# Patient Record
Sex: Male | Born: 1950 | Race: Black or African American | Hispanic: No | Marital: Married
Health system: Southern US, Community
[De-identification: ages and names within clinical notes are randomized; demographics above are authoritative.]

## PROBLEM LIST (undated history)

## (undated) DIAGNOSIS — Z940 Kidney transplant status: Secondary | ICD-10-CM

## (undated) DIAGNOSIS — I739 Peripheral vascular disease, unspecified: Secondary | ICD-10-CM

## (undated) HISTORY — DX: Peripheral vascular disease, unspecified: I73.9

## (undated) HISTORY — DX: Kidney transplant status: Z94.0

---

## 1998-01-30 ENCOUNTER — Emergency Department (HOSPITAL_COMMUNITY): Admission: EM | Admit: 1998-01-30 | Discharge: 1998-01-30 | Payer: Self-pay | Admitting: Emergency Medicine

## 1998-10-23 ENCOUNTER — Encounter: Payer: Self-pay | Admitting: Vascular Surgery

## 1998-10-23 ENCOUNTER — Ambulatory Visit (HOSPITAL_COMMUNITY): Admission: RE | Admit: 1998-10-23 | Discharge: 1998-10-23 | Payer: Self-pay | Admitting: Vascular Surgery

## 1998-10-27 ENCOUNTER — Encounter (HOSPITAL_COMMUNITY): Admission: RE | Admit: 1998-10-27 | Discharge: 1999-01-25 | Payer: Self-pay | Admitting: Nephrology

## 1999-01-26 ENCOUNTER — Encounter (HOSPITAL_COMMUNITY): Admission: RE | Admit: 1999-01-26 | Discharge: 1999-04-26 | Payer: Self-pay | Admitting: Nephrology

## 1999-05-04 ENCOUNTER — Encounter (HOSPITAL_COMMUNITY): Admission: RE | Admit: 1999-05-04 | Discharge: 1999-08-02 | Payer: Self-pay | Admitting: Nephrology

## 1999-09-25 ENCOUNTER — Ambulatory Visit (HOSPITAL_COMMUNITY): Admission: RE | Admit: 1999-09-25 | Discharge: 1999-09-26 | Payer: Self-pay | Admitting: General Surgery

## 1999-09-25 ENCOUNTER — Encounter: Payer: Self-pay | Admitting: General Surgery

## 1999-09-26 ENCOUNTER — Emergency Department (HOSPITAL_COMMUNITY): Admission: EM | Admit: 1999-09-26 | Discharge: 1999-09-26 | Payer: Self-pay | Admitting: Emergency Medicine

## 2004-03-17 ENCOUNTER — Encounter: Admission: RE | Admit: 2004-03-17 | Discharge: 2004-06-10 | Payer: Self-pay | Admitting: Nephrology

## 2005-10-15 ENCOUNTER — Ambulatory Visit (HOSPITAL_COMMUNITY): Admission: RE | Admit: 2005-10-15 | Discharge: 2005-10-15 | Payer: Self-pay | Admitting: Nephrology

## 2006-04-20 ENCOUNTER — Ambulatory Visit: Payer: Self-pay | Admitting: Gastroenterology

## 2006-04-28 ENCOUNTER — Ambulatory Visit (HOSPITAL_COMMUNITY): Admission: RE | Admit: 2006-04-28 | Discharge: 2006-04-28 | Payer: Self-pay | Admitting: Gastroenterology

## 2006-05-03 ENCOUNTER — Ambulatory Visit: Payer: Self-pay | Admitting: Gastroenterology

## 2006-10-04 ENCOUNTER — Emergency Department (HOSPITAL_COMMUNITY): Admission: EM | Admit: 2006-10-04 | Discharge: 2006-10-04 | Payer: Self-pay | Admitting: Emergency Medicine

## 2007-05-15 ENCOUNTER — Emergency Department (HOSPITAL_COMMUNITY): Admission: EM | Admit: 2007-05-15 | Discharge: 2007-05-15 | Payer: Self-pay | Admitting: Emergency Medicine

## 2007-05-22 ENCOUNTER — Emergency Department (HOSPITAL_COMMUNITY): Admission: EM | Admit: 2007-05-22 | Discharge: 2007-05-22 | Payer: Self-pay | Admitting: Emergency Medicine

## 2007-10-14 ENCOUNTER — Inpatient Hospital Stay (HOSPITAL_COMMUNITY): Admission: EM | Admit: 2007-10-14 | Discharge: 2007-10-18 | Payer: Self-pay | Admitting: Emergency Medicine

## 2009-03-11 ENCOUNTER — Inpatient Hospital Stay (HOSPITAL_COMMUNITY): Admission: EM | Admit: 2009-03-11 | Discharge: 2009-03-14 | Payer: Self-pay | Admitting: Emergency Medicine

## 2009-03-19 IMAGING — CT CT HEAD W/O CM
1 series · 16 of 28 positions shown, 20 images · IV contrast (agent unspecified)
Comparison: No prior studies are available for comparison.

CLINICAL DATA: Headache for 5 days. Dialysis patient. History of CVA, hypertension, and renal failure.
HEAD CT WITHOUT CONTRAST:
TECHNIQUE: Contiguous axial images were obtained from the base of the skull through the vertex according to standard protocol without contrast.

[Series 2: brain · axial · 0.47mm/px · z∈[+171,+304]mm · 16 of 28 slices shown, 20 images]
[im 2/28  brain]
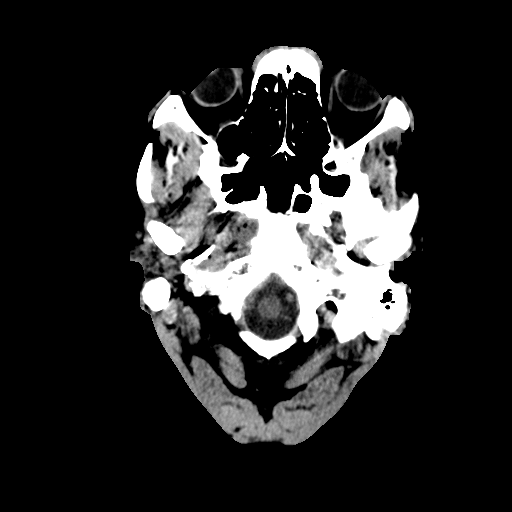
[im 2/28  bone]
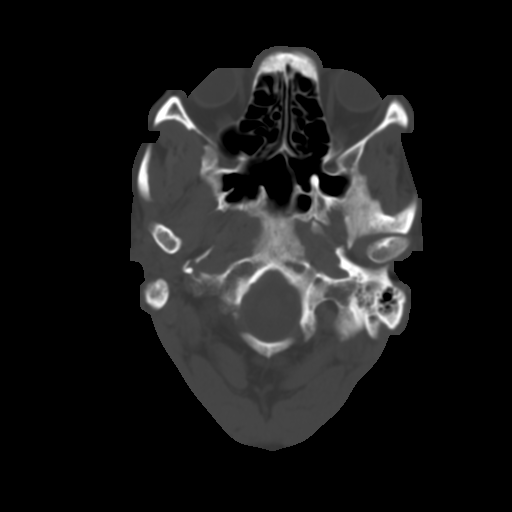
[im 4/28  brain]
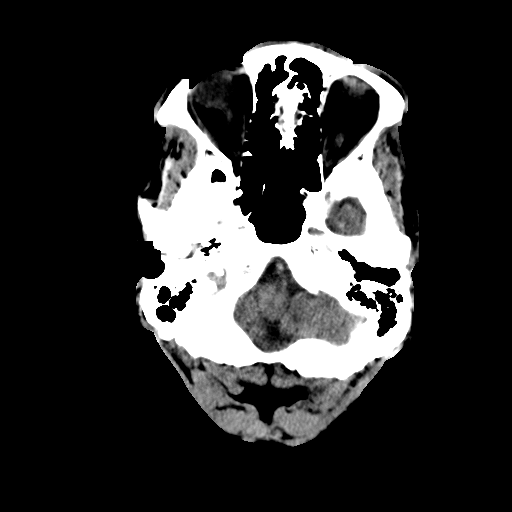
[im 6/28  brain]
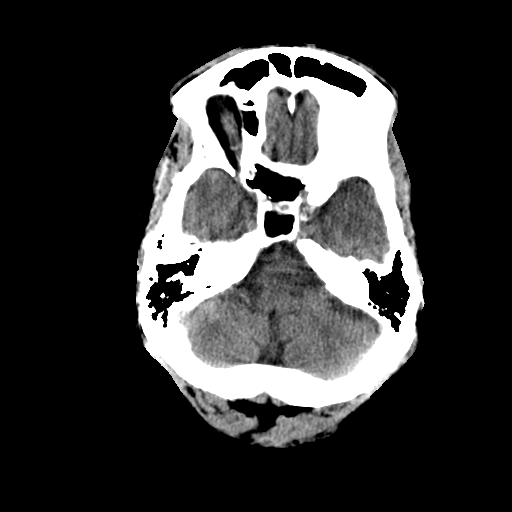
[im 7/28  brain]
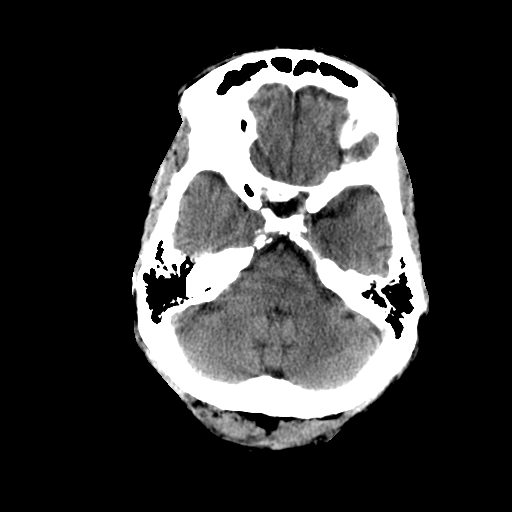
[im 9/28  brain]
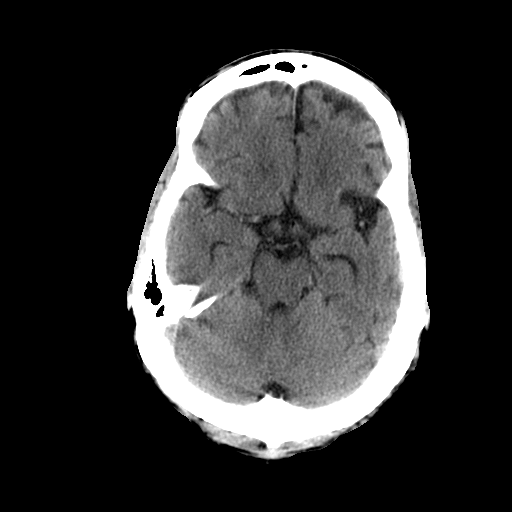
[im 9/28  bone]
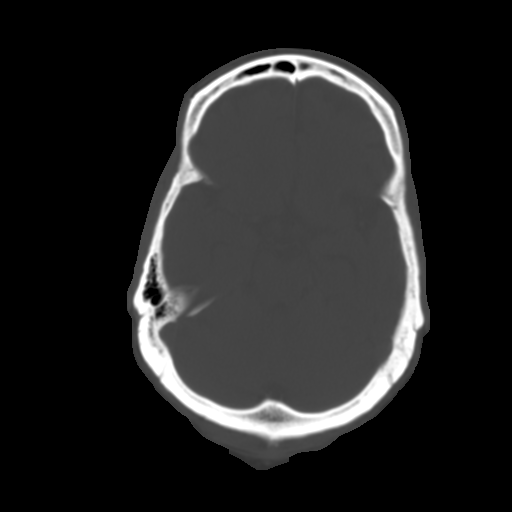
[im 10/28  brain]
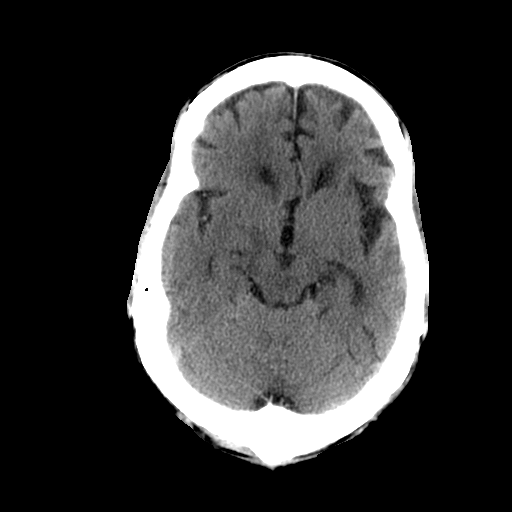
[im 12/28  brain]
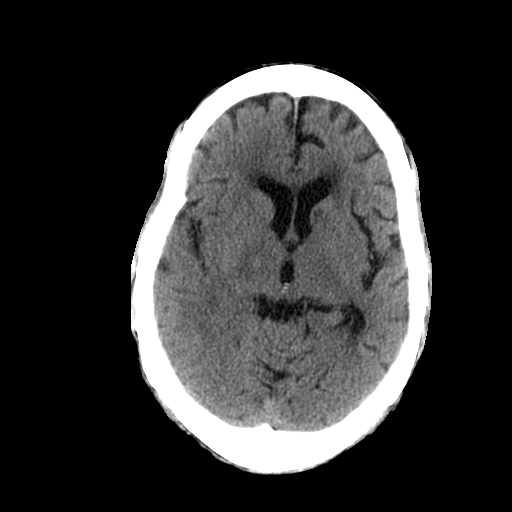
[im 14/28  brain]
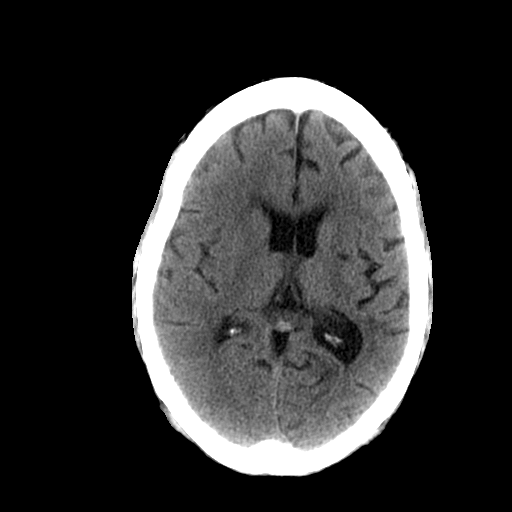
[im 15/28  brain]
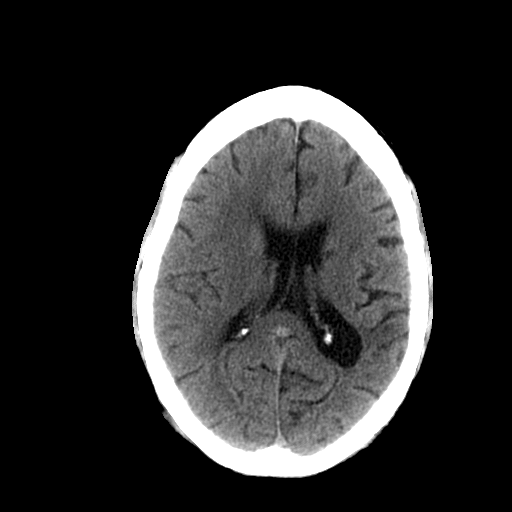
[im 15/28  bone]
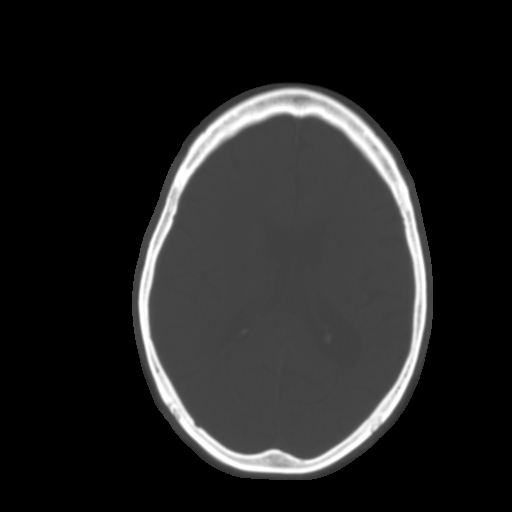
[im 17/28  brain]
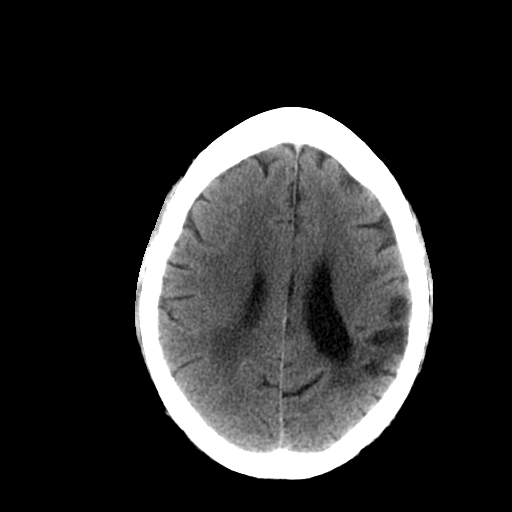
[im 19/28  brain]
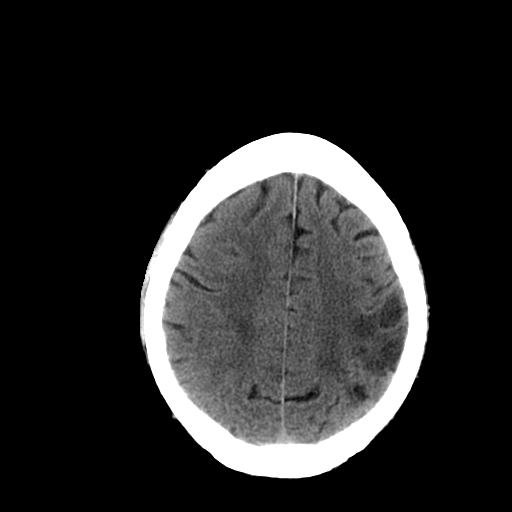
[im 20/28  brain]
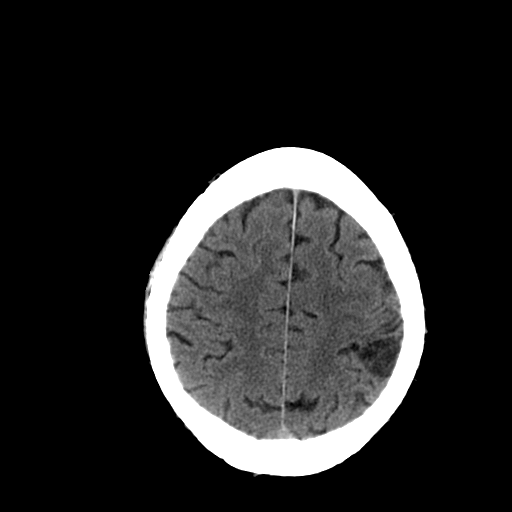
[im 22/28  brain]
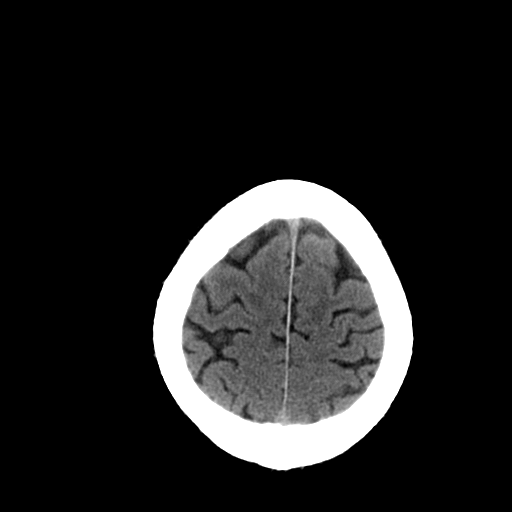
[im 22/28  bone]
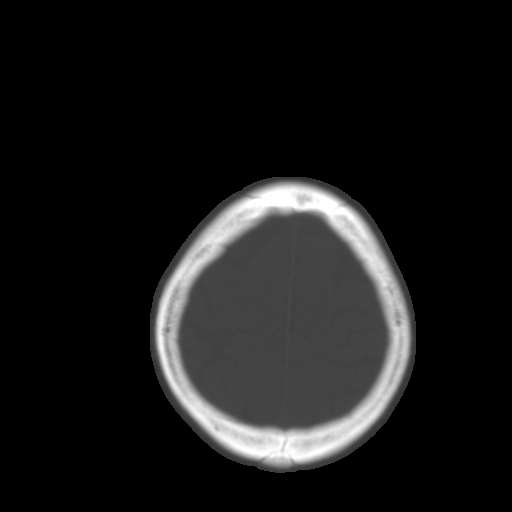
[im 23/28  brain]
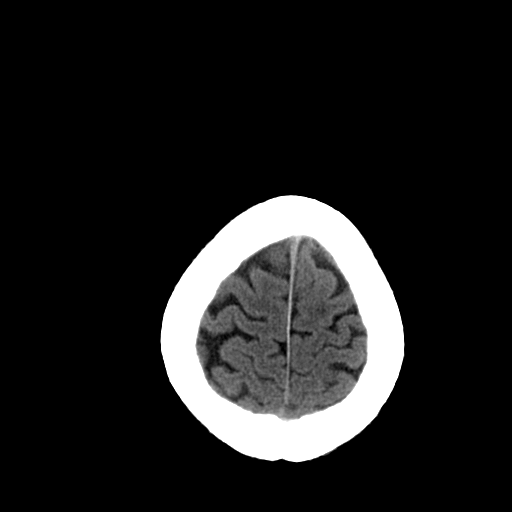
[im 25/28  brain]
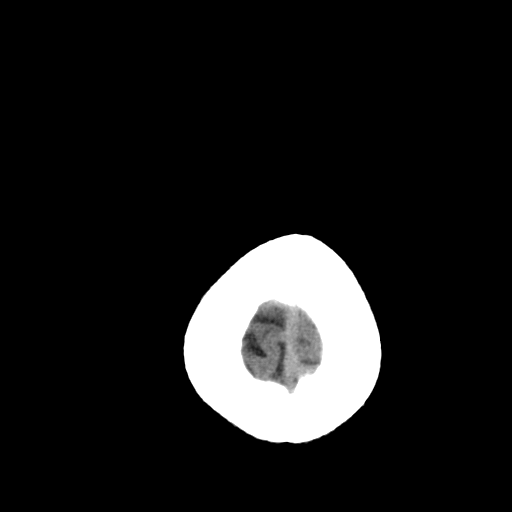
[im 27/28  brain]
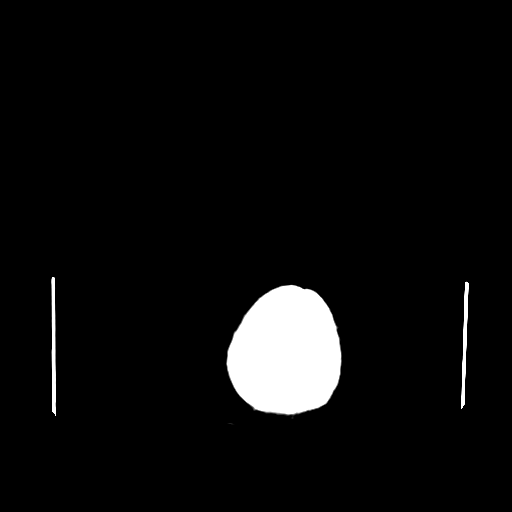

[16 of 28 positions shown; findings below may reference images not displayed]

FINDINGS: Cortical atrophic changes mainly involve the frontal lobes. Remote lacunar infarctions left caudate nuclear head and left putamen plus anterior aspect left caudate body.  Encephalomalacic changes left upper frontoparietal territory compatible with remote infarction involving a branch of the left MCA. No findings to strongly suggest an acute infarction. No hemorrhage or mass effect. No hydrocephalus.
IMPRESSION: No acute intracranial abnormality. Remote lacunar infarctions left basal ganglia and remote left upper frontoparietal infarction.

## 2009-08-18 IMAGING — CT CT PELVIS W/ CM
2 of 5 series · 16 of 46 positions shown, 18 images · IV contrast (omni 300/water & 100 ML OMNI 300)
Comparison: Abdominal radiograph 05/22/2007.

CT ABDOMEN

CLINICAL DATA: 56-year-old male with periumbilical pain times 7
hours.  End-stage renal disease on dialysis.  Left nephrectomy,
possibly for cancer.

CT ABDOMEN AND PELVIS WITH CONTRAST
TECHNIQUE: Multidetector CT imaging of the abdomen and pelvis was
performed using the standard protocol following bolus
administration of intravenous contrast.
Contrast: 100 ml Pmnipaque-WBB.

[Series 2: a&p w/ · axial · 0.66mm/px · z∈[-378,-34]mm · 13 of 79 slices shown, 15 images]
[im 5/79  soft-tissue]
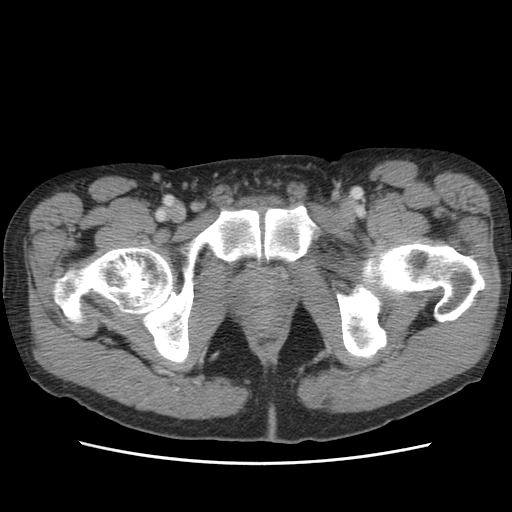
[im 5/79  bone]
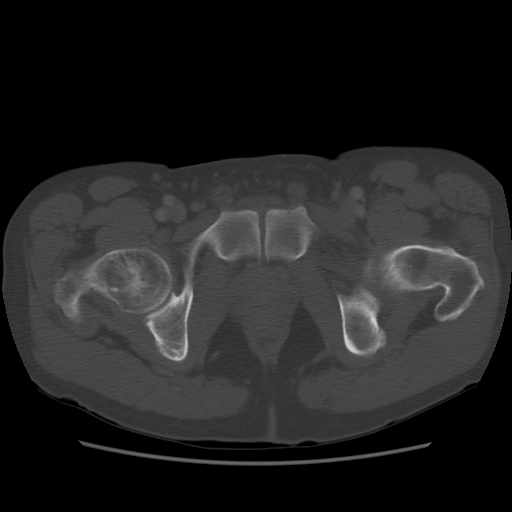
[im 13/79  soft-tissue]
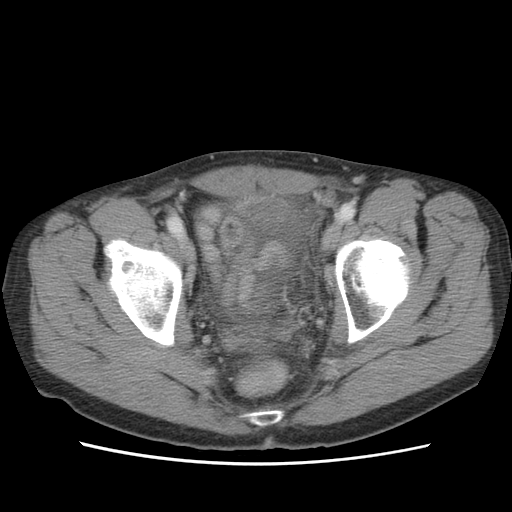
[im 17/79  soft-tissue]
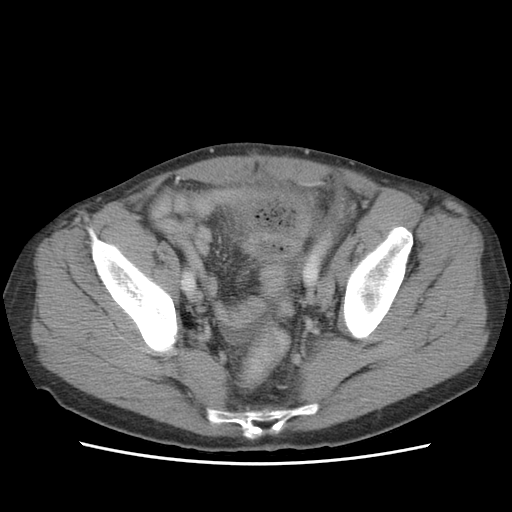
[im 21/79  soft-tissue]
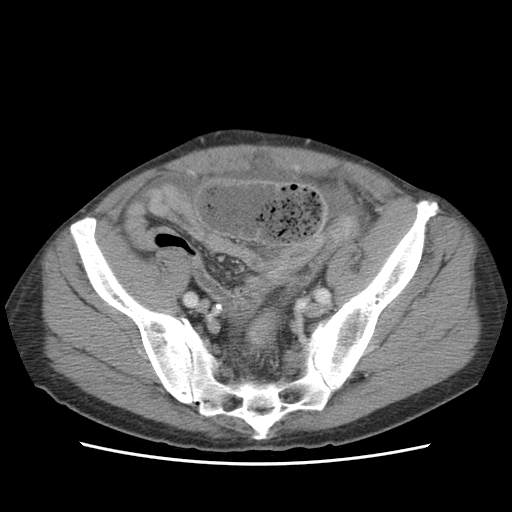
[im 29/79  soft-tissue]
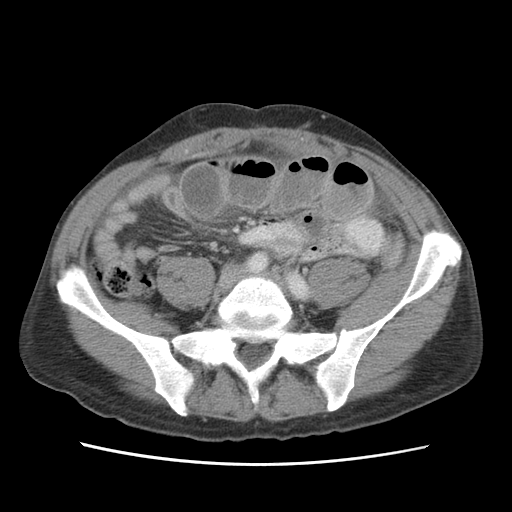
[im 33/79  soft-tissue]
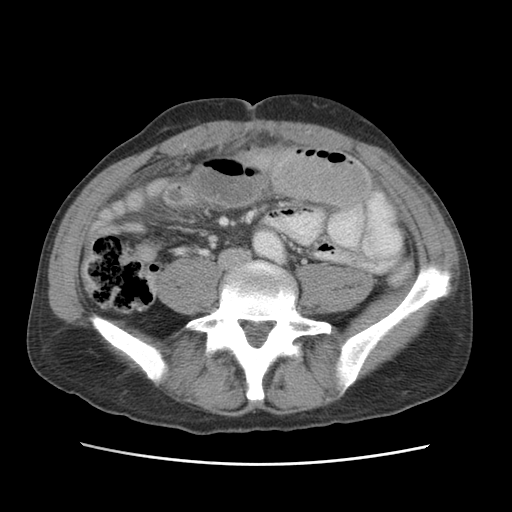
[im 42/79  soft-tissue]
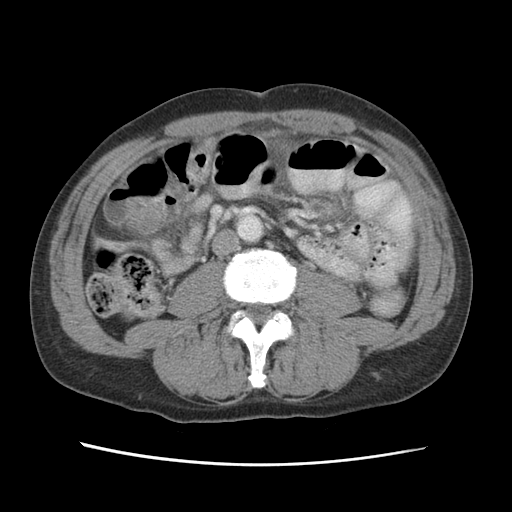
[im 46/79  soft-tissue]
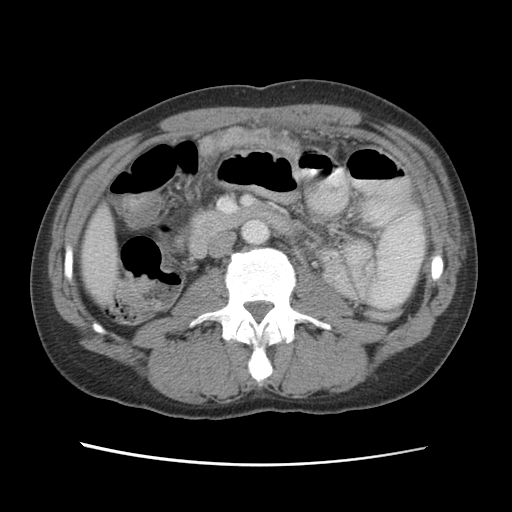
[im 50/79  soft-tissue]
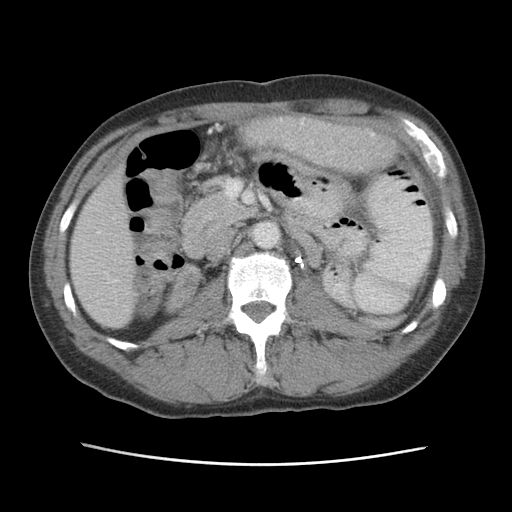
[im 50/79  bone]
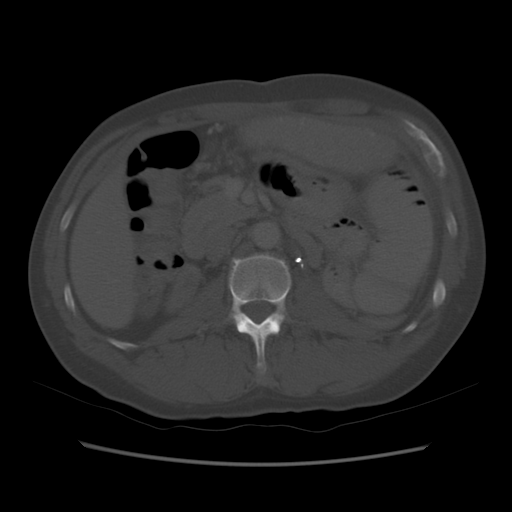
[im 58/79  soft-tissue]
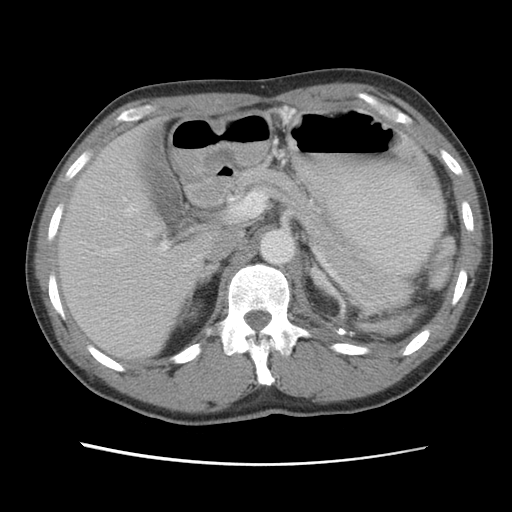
[im 62/79  soft-tissue]
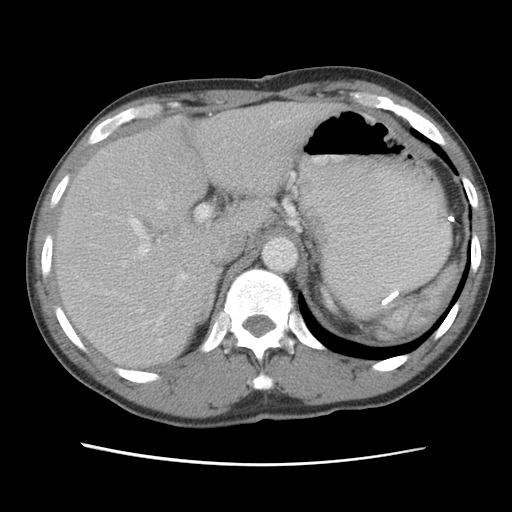
[im 66/79  soft-tissue]
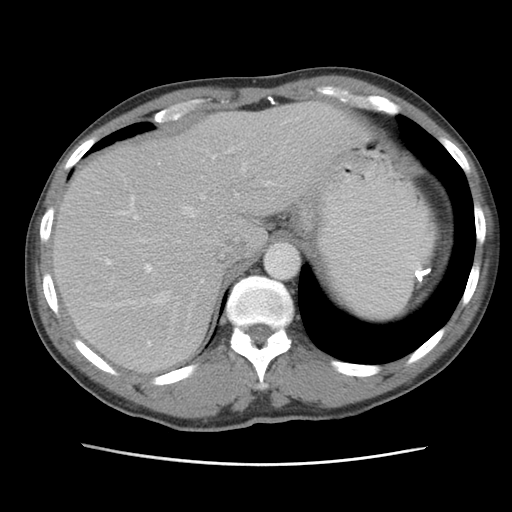
[im 74/79  soft-tissue]
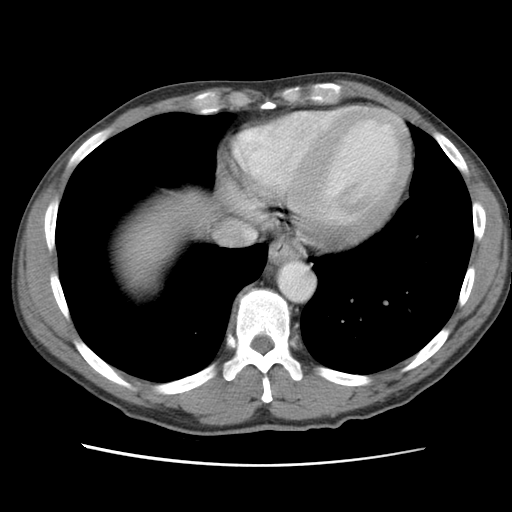

[Series 400: cor abd · coronal · 0.86mm/px · 3 of 86 slices shown]
[im 29/86  soft-tissue]
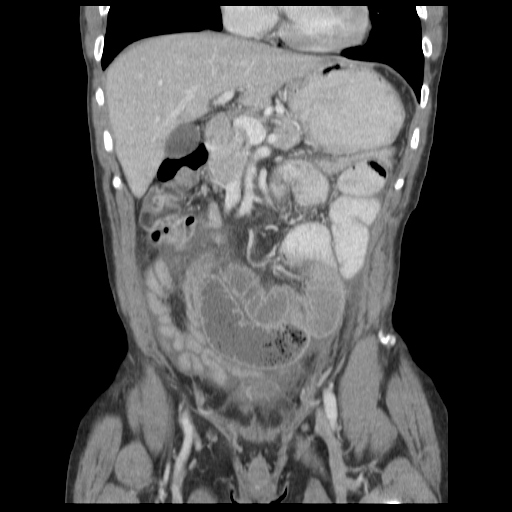
[im 38/86  soft-tissue]
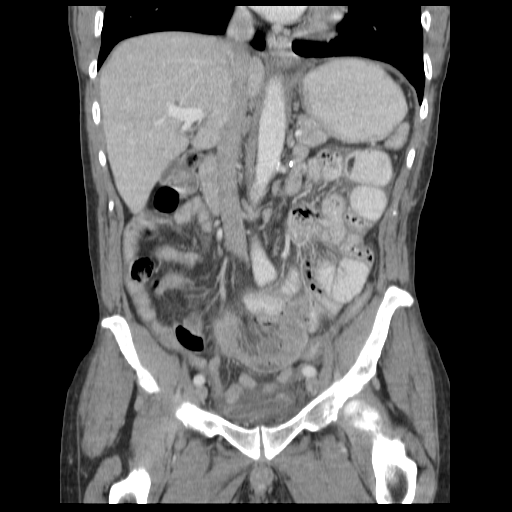
[im 48/86  soft-tissue]
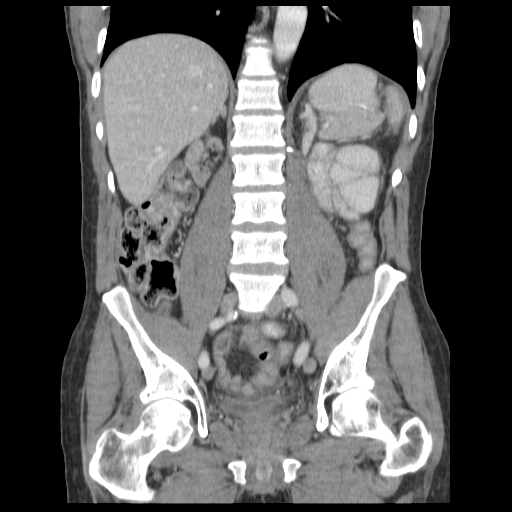

[16 of 46 positions shown; findings below may reference images not displayed]

FINDINGS: Minor dependent atelectasis.  Degenerative disc disease
at the lumbosacral junction.  No acute osseous abnormality.  The
liver, gallbladder, pancreas, and adrenal glands are within normal
limits.  The spleen and left kidney are surgically absent.  The
right kidney is atrophied.  The portal venous system and major
arterial structures in the abdomen are patent. There are multiple
surgical clips in the left renal bed and the gastrosplenic
ligament.

The stomach is dilated.  The proximal duodenum is mildly dilated,
the distal duodenum and ligament of Treitz area are decompressed.
There are then multiple nondilated opacified proximal small bowel
loops.  The mid jejunal loops within the, dilated and contain
multiple air-fluid levels, but are contrast filled.  These loops
measure up to 3.8 cm in diameter.  In the mid abdomen there are
multiple nonopacified dilated loops containing inspissated material
measuring up to 4 cm in diameter with hyperenhancing mucosa and
surrounding mesenteric inflammation.  There is a focal transition
point to the more distal bowel suspected in the lower quadrant just
to the left of midline (series 2 image 50) There is fluid within
the leaves of the small bowel mesentery at this location, and fluid
tracks into the pelvis.  There is some fluid around the hepatic
flexure which may be secondarily involved, no wall thickening
evident here.  Multiple loops of ileum are then decompressed to the
level of the ileocecal valve and the appendix is normal.  The
distal colon is completely decompressed, the splenic flexure is
difficult to delineate. No pneumoperitoneum.
IMPRESSION: 1.  Findings highly suspicious for closed loop mid small bowel
obstruction with extensive mesenteric inflammation and free fluid.
2.  Normal proximal and distal small bowel loops.  Normal appendix.
Fluid adjacent to the hepatic flexure felt to be related to the
small bowel process.
3.  Spleen and left kidney are surgically absent.  Right kidney is
atrophied.

CT PELVIS
FINDINGS: There is a small volume of free fluid which is tracking
from the inflamed small bowel mesentery described above.  Distal
large and small bowel loops are decompressed.  The bladder is
decompressed.  Major pelvic arterial structures are patent.  No
lymphadenopathy.  No acute osseous abnormality.
IMPRESSION: A small volume free fluid in the pelvis is tracking from the mid
small bowel process described above.

I discussed the above findings with Dr. Ingholiwe Pofadder by telephone at
2455 hours on 10/14/2007 the.

## 2009-09-15 ENCOUNTER — Ambulatory Visit: Payer: Self-pay | Admitting: Surgery

## 2009-10-03 ENCOUNTER — Ambulatory Visit (HOSPITAL_COMMUNITY): Admission: RE | Admit: 2009-10-03 | Discharge: 2009-10-03 | Payer: Self-pay | Admitting: Nephrology

## 2009-12-08 ENCOUNTER — Ambulatory Visit: Payer: Self-pay | Admitting: Surgery

## 2010-03-25 ENCOUNTER — Emergency Department (HOSPITAL_COMMUNITY)
Admission: EM | Admit: 2010-03-25 | Discharge: 2010-03-25 | Payer: Self-pay | Source: Home / Self Care | Admitting: Emergency Medicine

## 2010-05-17 ENCOUNTER — Encounter: Payer: Self-pay | Admitting: Nephrology

## 2010-05-26 ENCOUNTER — Encounter
Admission: RE | Admit: 2010-05-26 | Discharge: 2010-05-26 | Payer: Self-pay | Source: Home / Self Care | Attending: Nephrology | Admitting: Nephrology

## 2010-07-29 LAB — RENAL FUNCTION PANEL
Albumin: 3.1 g/dL — ABNORMAL LOW (ref 3.5–5.2)
BUN: 48 mg/dL — ABNORMAL HIGH (ref 6–23)
BUN: 76 mg/dL — ABNORMAL HIGH (ref 6–23)
CO2: 29 mEq/L (ref 19–32)
CO2: 30 mEq/L (ref 19–32)
Calcium: 9.3 mg/dL (ref 8.4–10.5)
Calcium: 9.5 mg/dL (ref 8.4–10.5)
Chloride: 96 mEq/L (ref 96–112)
Chloride: 98 mEq/L (ref 96–112)
Creatinine, Ser: 15.22 mg/dL — ABNORMAL HIGH (ref 0.4–1.5)
Creatinine, Ser: 17.93 mg/dL — ABNORMAL HIGH (ref 0.4–1.5)
GFR calc Af Amer: 3 mL/min — ABNORMAL LOW (ref >60)
GFR calc non Af Amer: 3 mL/min — ABNORMAL LOW (ref >60)
GFR calc non Af Amer: 4 mL/min — ABNORMAL LOW (ref >60)
Glucose, Bld: 104 mg/dL — ABNORMAL HIGH (ref 70–99)
Glucose, Bld: 94 mg/dL (ref 70–99)
Potassium: 4.4 mEq/L (ref 3.5–5.1)
Sodium: 137 mEq/L (ref 135–145)

## 2010-07-29 LAB — DIFFERENTIAL
Basophils Absolute: 0 10*3/uL (ref 0.0–0.1)
Basophils Relative: 0 % (ref 0–1)
Eosinophils Absolute: 0.2 10*3/uL (ref 0.0–0.7)
Eosinophils Relative: 1 % (ref 0–5)
Lymphocytes Relative: 7 % — ABNORMAL LOW (ref 12–46)
Lymphs Abs: 0.3 10*3/uL — ABNORMAL LOW (ref 0.7–4.0)
Lymphs Abs: 1.2 10*3/uL (ref 0.7–4.0)
Monocytes Absolute: 1 10*3/uL (ref 0.1–1.0)
Monocytes Relative: 3 % (ref 3–12)
Monocytes Relative: 6 % (ref 3–12)
Neutro Abs: 15.3 10*3/uL — ABNORMAL HIGH (ref 1.7–7.7)
Neutrophils Relative %: 90 % — ABNORMAL HIGH (ref 43–77)

## 2010-07-29 LAB — COMPREHENSIVE METABOLIC PANEL
ALT: 14 U/L (ref 0–53)
CO2: 28 mEq/L (ref 19–32)
Creatinine, Ser: 16.39 mg/dL — ABNORMAL HIGH (ref 0.4–1.5)
GFR calc Af Amer: 4 mL/min — ABNORMAL LOW (ref >60)
GFR calc non Af Amer: 3 mL/min — ABNORMAL LOW (ref >60)
Sodium: 130 mEq/L — ABNORMAL LOW (ref 135–145)
Total Bilirubin: 0.7 mg/dL (ref 0.3–1.2)

## 2010-07-29 LAB — URINALYSIS, ROUTINE W REFLEX MICROSCOPIC
Glucose, UA: 100 mg/dL — AB
Specific Gravity, Urine: 1.011 (ref 1.005–1.030)
pH: 8.5 — ABNORMAL HIGH (ref 5.0–8.0)

## 2010-07-29 LAB — CBC
HCT: 38 % — ABNORMAL LOW (ref 39.0–52.0)
HCT: 42.5 % (ref 39.0–52.0)
HCT: 42.9 % (ref 39.0–52.0)
Hemoglobin: 12.7 g/dL — ABNORMAL LOW (ref 13.0–17.0)
Hemoglobin: 13.4 g/dL (ref 13.0–17.0)
Hemoglobin: 14.4 g/dL (ref 13.0–17.0)
MCHC: 33.7 g/dL (ref 30.0–36.0)
MCHC: 33.7 g/dL (ref 30.0–36.0)
MCHC: 33.7 g/dL (ref 30.0–36.0)
MCV: 100.4 fL — ABNORMAL HIGH (ref 78.0–100.0)
MCV: 100.5 fL — ABNORMAL HIGH (ref 78.0–100.0)
MCV: 101.2 fL — ABNORMAL HIGH (ref 78.0–100.0)
MCV: 99.9 fL (ref 78.0–100.0)
Platelets: 187 10*3/uL (ref 150–400)
Platelets: 208 10*3/uL (ref 150–400)
Platelets: 246 10*3/uL (ref 150–400)
RBC: 3.78 MIL/uL — ABNORMAL LOW (ref 4.22–5.81)
RBC: 3.93 MIL/uL — ABNORMAL LOW (ref 4.22–5.81)
RBC: 4.24 MIL/uL (ref 4.22–5.81)
RBC: 4.26 MIL/uL (ref 4.22–5.81)
RDW: 14.8 % (ref 11.5–15.5)
WBC: 12.5 10*3/uL — ABNORMAL HIGH (ref 4.0–10.5)
WBC: 17 10*3/uL — ABNORMAL HIGH (ref 4.0–10.5)
WBC: 17.7 10*3/uL — ABNORMAL HIGH (ref 4.0–10.5)
WBC: 9.7 10*3/uL (ref 4.0–10.5)

## 2010-07-29 LAB — URINE CULTURE: Culture: NO GROWTH

## 2010-07-29 LAB — BASIC METABOLIC PANEL
Calcium: 9.3 mg/dL (ref 8.4–10.5)
Creatinine, Ser: 15.74 mg/dL — ABNORMAL HIGH (ref 0.4–1.5)
GFR calc Af Amer: 4 mL/min — ABNORMAL LOW (ref >60)
GFR calc non Af Amer: 3 mL/min — ABNORMAL LOW (ref >60)

## 2010-07-29 LAB — URINE MICROSCOPIC-ADD ON

## 2010-09-08 NOTE — Assessment & Plan Note (Signed)
OFFICE VISIT   TERRI, MALERBA  DOB:  [birth year]                                       12/08/2009  EAVWU#:[id number]   The patient comes back in today for follow-up of his left great cephalic  fistula.  I initially saw him in May at the request of Dr. Hyman Hopes for  increasing size of his radiocephalic fistula.  At that time it appeared  to me that the whole vein had dilated and he needed to be evaluated for  a proximal stenosis.  This was done recently at [facility].  This  revealed no hemodynamically significant stenosis.   PHYSICAL EXAMINATION:  Today he is afebrile, hemodynamically stable.  Cardiovascular:  Regular rate and rhythm.  Respirations nonlabored.  The  vein shows no significant change.  There is no ulceration.  There is no  erythema or evidence of infection.   PLAN:  I outlined three options for management.  1. Will be continued observation.  2. Ligation of his fistula.  3. Resection of the vein.   I told him that at this point in time I would recommend proceeding with  observation as he will most likely have this problem develop in future  fistulas.  As long as he not having problem with impending rupture or  ulceration, I think this would be appropriate.     Jorge Ny, MD  Electronically Signed   VWB/MEDQ  D:  12/08/2009  T:  12/09/2009  Job:  [id number]   cc:   Dr. Hyman Hopes  Dr. Briant Cedar

## 2010-09-08 NOTE — Assessment & Plan Note (Signed)
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   HISTORY:  This is a 60 year old gentleman I am seeing at the request of  Dr. Hyman Hopes for evaluation of increasing aneurysm size in his left arm  radiocephalic fistula.  The patient suffers from hypertension as well as  end-stage renal disease.  He dialyzes on Tuesday, Thursday, Saturday.  His fistula was put in over 11 years ago.  He has had an interval where  he was on peritoneal dialysis.  He says that over the last many years,  the size of these aneurysms has increased.  There has not been an acute  change.  He is not having skin breakdown.  He is not having ulceration  or bleeding or problems on dialysis.   The patient suffers from hyperlipidemia, however, he is not on statin  secondary to liver failure.  He has had a subdural hematoma and  subarachnoid bleed secondary to coagulopathy which caused a seizure  disorder for which he is being medicated for.   REVIEW OF SYSTEMS:  Positive for renal disease, history of stroke and  seizures.  All other review of systems are negative as documented on the  encounter form.   PAST MEDICAL HISTORY:  Hypertension, seizure disorder, liver failure  secondary to Zosyn, hypertension,  hypoparathyroid,  history of  Meniere's disease,  secondary hyperparathyroidism, iron deficiency  anemia and gout.   FAMILY HISTORY:  Negative for cardiovascular disease at an early age.   SOCIAL HISTORY:  Married with 4 children.  He is a Education officer, environmental.  He does not  smoke or drink.   ALLERGIES:  None.   PHYSICAL EXAMINATION:  Heart rate is  67, blood pressure 176/91,  temperature is 97.9.  General:  Well-appearing in no distress.  Respirations:  Nonlabored.  Cardiovascular:  Regular rate and rhythm.  The left radiocephalic fistula is  markedly dilated.  There are 2  areas  of aneurysmal change which correlate where access sites have been  repetitively.  There is no skin breakdown.  No ulceration.  No evidence  of infection.   ASSESSMENT/PLAN:  Left arm access issues.   PLAN:  There is not an acute change in the patient's access.  The one  thing I would be concerned about would be a proximal stenosis that could  be causing higher pressure within his fistula causing it to increase in  size.  Our best way to evaluate this would be with a fistulogram which I  have recommended.  The patient has considered getting this done at the  [facility].  I told him that either would be appropriate.  He is going  to contact me if he was wants me to do this versus having it done at the  [place].  Any questions have been answered today.     Jorge Ny, MD  Electronically Signed   VWB/MEDQ  D:  09/15/2009  T:  09/16/2009  Job:  [id number]   cc:   Garnetta Buddy, M.D.

## 2010-09-08 NOTE — Discharge Summary (Signed)
NAMEMarland Kitchen  Derrick, Benton NO.:  [id number]   MEDICAL RECORD NO.:  [id number]          PATIENT TYPE:  INP   LOCATION:  6727                         FACILITY:  [facility]   PHYSICIAN:  Maree Krabbe, M.D.DATE OF BIRTH:  04-06-1951   DATE OF ADMISSION:  10/14/2007  DATE OF DISCHARGE:  10/18/2007                               DISCHARGE SUMMARY   DISCHARGE DIAGNOSES:  1. Small bowel obstruction, resolved.  2. Hypertension.  3. Seizure disorder.  4. Anemia.  5. Secondary hyperparathyroidism.  6. Hypothyroidism.  7. End-stage renal disease.   PROCEDURES:  October 14, 2007, CT of the abdomen with contrast.   IMPRESSION:  1. Findings highly suspicious for close loop, mid small bowel      obstruction with extensive mesenteric inflammation of free fluid.  2. Normal proximal and distal small bowel loops, normal appendix fluid      adjacent to the hepatic flexure felt to be related to small bowel      process.  3. Spleen and left kidney are surgically absent.  Right kidney      atrophy.  October 14, 2007, pelvis CT with contrast, impression of      small volume free fluid in the pelvis, is tracking from the mid      small bowel process described above.   CONSULTATIONS:  1. [facility].  2. Dr. Lindie Spruce.   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  He had similar symptoms in the  past.  He had a small bowel obstruction in approximately 2005.  He was  treated conservatively at the [facility].  In the emergency room, the patient's white count is elevated and a CAT  scan of the  abdomen confirmed small bowel obstruction.  He is being  admitted now for conservative management.  The patient denies any bowel  movements, fever, chills, shortness of breath, chest pain, or cough.   ADMISSION LABORATORY DATA:  WBC 13.8, hemoglobin 15.8, hematocrit 48.3,  platelet 256.  Sodium 140, potassium 4.4, chloride 94, CO2 31, glucose  128, BUN 28, creatinine 12.67, total bilirubin 0.9, alkaline phos 167,  AST 14, ALT is 14, total protein 8.0, albumin 4.3, calcium 9.2, lipase  34.   DIAGNOSTIC RADIOLOGICAL EXAMINATIONS:  October 15, 2007 acute abdomen  series.  Impression,  1. Nasogastric tube within the stomach.  2. Post surgical changes in the abdomen.  3. A few air-fluid levels are present consistent with small bowel      obstructions seen on prior CT.  Bowel gas pattern appears improved      compared to CT.   October 16, 2007, 2-view abdomen.  Impression,  Resolution of previously noted air fluid levels suggest bowel  obstruction is improving.   HOSPITAL COURSE:  1. Small bowel obstruction, resolved.  The patient was admitted.  NG      tube was placed to low suction.  He was initiated on empiric IV      antibiotic therapy and was placed on GI rest.  [facility] was consulted.  They recommended followup abdominal films      which were performed as stated above.  The patient ambulated.      Eventually his NG tube was clamped, and his diet was progressed as      well as changing his medications from IV to oral as his small bowel      obstruction resolved per the films as stated above.  The patient      tolerated his diet and oral medication therapy well and without      difficulty.  2. Hypertension.  The patient was initially placed on IV      antihypertensive medications as well as transdermal secondary to      his n.p.o. status.  As his bowel obstruction resolved, his      antihypertensive regimen was changed to oral therapy and by      discharge, the  patient's blood pressure was 165/93.  3. Seizure disorder.  The patient was initially placed on IV seizure      medication at the time of admission secondary to his n.p.o. status.      He will be changed to his outpatient seizure medication regimen at      the time of discharge.  4. Anemia.  The patient's Epogen therapy was placed on hold secondary      to elevated hemoglobin level.  His hemoglobin remained stable      during this hospitalization, and he remained without active signs      of bleeding.  Prior to discharge, his hemoglobin was 13.3.  5. Secondary hyperparathyroidism.  The patient continued Hectorol      therapy with hemodialysis treatment.  His phosphate binder therapy      was initially held secondary to his n.p.o. status and later      discontinued as it was felt that it may be contributing to his      bowel obstruction.  His calcium and phosphorus levels remained at      goal during this hospital stay.  Calcium at discharge was 8.6 and      phosphorus 3.6.  He will be discharged off phosphate binder therapy      at this time.  This will need to be followed up at the [facility] and initiation of phosphate binder therapy as      deemed appropriate based on lab values.  6. Hypothyroidism.  The patient was placed on IV replacement therapy      secondary to his n.p.o. status.  He will be changed back to his      oral therapy at the time of discharge.  7. End-stage renal disease.  The patient continued hemodialysis during      his hospitalization via a left upper extremity AV fistula.  Average      ultrafiltration was approximately 2 liters.  Average blood flow      rate was 400 systolic, blood pressure ranged from the low 100s to      180 and heart rate ranged from the 50s to low 100s.   DISCHARGE MEDICATIONS:  1. Synthroid 0.075 mg 1 tablet p.o. daily.  2. Omeprazole 20 mg 1 p.o. nightly.  3. Lopressor 100 mg p.o. b.i.d.  4. Norvasc 5 mg p.o.  q.h.s.  5. Lisinopril 40 mg p.o. b.i.d.  6. Nephro-Vite 1 tablet p.o. daily.  7. Sensipar 30 mg p.o. daily with food.  8. Clonidine 0.1 mg 3 tabs p.o. t.i.d. on Monday, Wednesdays, and      Fridays and 2 tablets p.o. t.i.d. on all other days.  9. Tegretol 200 mg p.o. t.i.d.   HEMODIALYSIS MEDICATIONS:  Hectorol 2.5 mcg IV every hemodialysis  treatment.  Epogen is currently on hold secondary to elevated hemoglobin  level.  No change to outpatient heparin therapy.   DISCHARGE INSTRUCTIONS:  The patient is instructed to stop his Renagel  at the time of discharge.  He will resume a renal diet with a 1200-mL  fluid restriction.  Activity as tolerated.  He is to return to his  Outpatient Hemodialysis Center as scheduled prior to admission.  Hemodialysis instructions, the patient's estimated dry weight has been  decreased to 65 kg.  No other hemodialysis changes at this time.   Please note it took approximately 45 minutes to complete this discharge.      Tracey P. Sherrod, NP      Maree Krabbe, M.D.  Electronically Signed    TPS/MEDQ  D:  10/18/2007  T:  10/19/2007  Job:  [id number]   cc:   [facility]  [facility]  Dr. Lindie Spruce with [facility]

## 2010-09-08 NOTE — H&P (Signed)
NAMEMarland Kitchen  Derrick Benton, Derrick Benton NO.:  [id number]   MEDICAL RECORD NO.:  [id number]          PATIENT TYPE:  INP   LOCATION:  6727                         FACILITY:  [facility]   PHYSICIAN:  Maree Krabbe, M.D.DATE OF BIRTH:  15-Oct-1950   DATE OF ADMISSION:  10/14/2007  DATE OF DISCHARGE:                              HISTORY & PHYSICAL   REASON FOR ADMISSION:  Small-bowel obstruction.   HISTORY OF PRESENT ILLNESS:  A 60 year old black male with end-stage  renal disease secondary to chronic glomerulonephritis on chronic  hemodialysis every Tuesday, Thursday, Saturday at the [facility] presents to the emergency room with a 12- to 24-hour history of  acute onset of a varying sharp to dull constant abdominal pain and  nausea and vomiting after eating out yesterday.  He has had similar  symptoms in the past when he knows he had a small-bowel obstruction  approximately 2005, and was treated conservatively at the [facility].  In the emergency room, the patient's  white count is elevated and a CAT scan of the abdomen confirms small-  bowel obstruction.  He is being admitted now for conservative  management.  The patient denies any bowel movements, fever, chills,  shortness of breath, chest pain, or cough.   ALLERGIES:  Possibly TIAZAC and ATENOLOL, also a ZOSYN-related acute  liver failure.   CURRENT MEDICATIONS:  1. Clonidine 0.3 mg t.i.d.  2. Metoprolol 100 mg b.i.d.  3. Norvasc 5 mg daily.  4. Lisinopril 40 mg b.i.d.  5. MiraLax one scoop daily.  6. Colace 100 mg b.i.d.  7. Sensipar 30 mg daily.  8. Renagel 800 mg three with meals.  9. Nephro-Vite one daily.  10.Tegretol 200 mg t.i.d.  11.Prilosec 20 mg at bedtime.  12.Synthroid 75 mcg daily.  13.EPO 1400 units IV each dialysis.  14.Hectorol 2.5 mcg IV each dialysis.   PAST MEDICAL HISTORY:  1. 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  2. History of peritoneal dialysis terminated secondary to gram-      negative rod peritonitis in 2005.  3. Hypertension.  4. Hypothyroidism.  5. Hyperlipidemia.  6. Secondary hyperparathyroidism.  7. Iron deficiency anemia.  8. Gout.  9. History of liver toxicity felt related to Zosyn with acute liver      failure, coagulopathy, and intra-abdominal splenic hemorrhage      requiring splenectomy.  10.Subdural hematoma and subarachnoid bleed secondary to coagulopathy      with sequelae of seizure disorder in April 2005.  11.Small-bowel obstruction secondary to adhesions status post lysis      [place], [place], December 2005.  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   SOCIAL HISTORY:  The patient is married, lives with his wife, does not  drink alcohol or use any tobacco products.  He has four children and has  worked as a Education officer, environmental in the past.   FAMILY HISTORY:  Negative for kidney disease.  Positive for hypertension  and  MI.   REVIEW OF SYSTEMS:  Negative for headache, seizure activity, visual or  auditory disturbances, URI, cough, shortness of breath, hemoptysis,  chest pain, palpitations, dysuria, hematuria, leg edema, or bleeding.   PHYSICAL EXAMINATION:  VITAL SIGNS:  On admission, blood pressure  218/107, temperature 98.2, pulse is 77, and respirations are 20.  GENERAL:  A well-developed, well-nourished black male, who is in no  acute distress.  He is awake, alert, appropriate, oriented x3, pleasant,  and cooperative with an NG tube in left nares.  HEENT:  Normocephalic, atraumatic.  Pupils are equal, round, reactive to  light.  Sclerae anicteric.  No facial edema or drooping.  NECK:  Supple without thyromegaly.  LUNGS:  Clear to auscultation.  HEART:  Regular rate and rhythm.  No rub or gallop.  ABDOMEN:  Moderate distention with rare bowel sound present, semi-taut,  and moderate diffuse tenderness  to palpation.  No palpable masses.  Several well-healed scars.  No palpable hepatic enlargement.  EXTREMITIES:  Large tortuous left forearm AV fistula.  Positive bruit.  No lower extremity edema.   LABORATORY DATA:  On admission, white count 13,800 with 86 segs, 8  lymphocytes, 6 monocytes, 0 eosinophils, hemoglobin 15.8, platelets  256,000.  Sodium 140, potassium 4.4, chloride 94, CO2 31, glucose 128,  BUN 28, creatinine 12.67, calcium 9.2, SGOT 14, albumin 4.3, lipase 34.  CT of the abdomen, findings highly suspicious for close loop small-bowel  obstruction with extensive mesenteric inflammation of the free fluid.  Normal appearing appendix, proximal and distal small bowel loops.  Spleen and left kidney are absent.  Right kidney atrophy.   ASSESSMENT/PLAN:  1. Small-bowel obstruction.  Plan to admit the patient and use      conservative management initially with NG tube to low suction,      empiric IV antibiotics, GI rest with surgery following closely.  2. End-stage renal disease lieu dialysis today and every Tuesday,      Thursday, Saturday.  Outpatient facility at [facility].  The patient runs for 3-1/2 hours, on a standard 2.0      potassium bath.  3. Anemia.  Continue EPO 1400 units IV each dialysis.  4. Secondary hyperparathyroidism.  Continue Hectorol 2.5 mcg IV each      dialysis.  5. Hypertension.  We will have to switch to transdermal and      intravenous medications while the patient is n.p.o.      specifically using a Catapres patch, IV metoprolol, and Vasotec.  6. Hypothyroidism.  Use IV Synthroid.  7. Seizure disorder.  We will switch to IV antiseizure medicine.      Zenovia Jordan, P.A.      Maree Krabbe, M.D.  Electronically Signed    RRK/MEDQ  D:  10/14/2007  T:  10/14/2007  Job:  [id number]   cc:   [facility]  Dr. Lindie Spruce

## 2010-09-11 NOTE — Op Note (Signed)
[facility]  Patient:    Derrick Benton, Derrick Benton                     MRN: [id number] Proc. Date: 09/25/99 Adm. Date:  [id number] Disc. Date: [id number] Attending:  Otilio Connors Iv CC:         Anselm Pancoast. Zachery Dakins, M.D.             Richard F. Caryn Section, M.D.                           Operative Report  PREOPERATIVE DIAGNOSIS:  End-stage renal disease secondary to hypertension, desires chronic ambulatory peritoneal dialysis.  POSTOPERATIVE DIAGNOSIS:  End-stage renal disease secondary to hypertension, desires chronic ambulatory peritoneal dialysis.  OPERATION PERFORMED:  Placement of a [place] CAPD catheter.  SURGEON:  Anselm Pancoast. Zachery Dakins, M.D.  ASSISTANT:  Nurse.  ANESTHESIA:  General.  INDICATIONS FOR PROCEDURE:  The patient is a 60 year old black male hypertensive patient, who has progressed to renal failure. He has been on hemodialysis for about six to eight months and desires to switch to chronic ambulatory peritoneal dialysis.  He appears to be a good candidate for this. He has no other medical problems.  No previous abdominal surgery and understands the technique quite well after discussing it with the training nurses, the physicians and videos.  I am planning to place a [place] catheter in the right lower quadrant.  He had hemodialysis yesterday and his potassium is normal today.  Preoperatively, he was given a gram of Kefzol and taken to the operative suite.  DESCRIPTION OF PROCEDURE:  We elected to use general anesthesia and he was LAO tube induction nicely.  The abdomen was prepped with Betadine surgical scrub and solution and draped in a sterile manner.  A small area to the right below the umbilicus was made down through the skin and subcutaneous Scarpas, anterior rectus fascia.  The muscle was split, the posterior fascia.  The layers at this point were all anesthetized to minimize the postoperative pain with Marcaine with Adrenalin.  The  posterior rectus fascia and peritoneum were picked up between hemostats.  A small opening made in this quite thick posterior layer and two pursestring sutures of 2-0 Vicryl were placed.  The CAPD catheter with guide was placed in the lower abdomen.  Pursestring sutures tied with a Silastic ball within the peritoneal cavity, the posterior cuff is lying flat on the posterior rectus fascia.  The rectus muscle was approximated with 3-0 chromic and the anterior rectus fascia was closed with interrupted 2-0 Prolene.  The catheter was tunneled subcutaneously, the subcutaneous tissues closed with 3-0 chromic and 4 nylon simple sutures to the skin.  The patients catheter was hooked up to the connector on extension and then flushed.  It flushes easily, returns easily, and then capped off.  I think we will possibly let the patient go home this afternoon and he will continue with his hemodialysis for approximately two more weeks and then we will start peritoneal dialysis probably approximately two weeks from now after completing home training. DD:  09/25/99 TD:  09/29/99 Job: [id number]

## 2010-09-11 NOTE — Assessment & Plan Note (Signed)
[facility]                         GASTROENTEROLOGY OFFICE NOTE   Derrick Benton, Derrick Benton                     MRN:          [id number]  DATE:04/20/2006                            DOB:          [birth year]    REASON FOR REFERRAL:  Dr. Briant Cedar asked me to evaluate Derrick Benton in  consultation regarding screening colonoscopy.   HISTORY OF PRESENT ILLNESS:  Derrick Benton is a very pleasant 60 year old  man who has been hemodialysis dependent for seven years.  He is being  worked up for kidney transplant and is in the final stages of that work-  up and is here to discuss colon cancer screening options for  colonoscopy.  He does believe he had some type of colonoscopy or  sigmoidoscopy in the last five years to 10 years or so.  He does not  remember if that was done here in [place] or in [place].  I checked  the hospital computer records as well as our own records here and I've  found no report of any colonoscopy.  He has had no overt GI bleeding, no  troubles with constipation or diarrhea.   REVIEW OF SYSTEMS:  Essentially normal and is available on the nursing  intake sheet.   PAST MEDICAL HISTORY:  1. Chronic renal failure from longstanding hypertension.  2. Stroke in 2005.  3. Hypertension.  4. Some type of ulcer procedure in 2005.   CURRENT MEDICATIONS:  Metoprolol, lisinopril, omeprazole, __________ ,  Nephro-Vite, Renagel, clonidine, levothyroxine.   ALLERGIES:  PENICILLIN.   SOCIAL HISTORY:  Married with four children.  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  Nonsmoker, nondrinker.   FAMILY HISTORY:  No colon cancer, colon polyps in the family.   PHYSICAL EXAMINATION:  VITAL SIGNS:  5 feet 9 inches, 170 pounds.  Blood  pressure 152/98, pulse 76.  CONSTITUTIONAL:  Generally well-appearing.  NEUROLOGIC:  Alert and oriented x3.  HEENT:  Extraocular movements intact. Mouth and oropharynx moist. No  lesions.  NECK:  Supple.  No  lymphadenopathy.  CARDIOVASCULAR:  Regular rate and rhythm.  LUNGS:  Clear to auscultation bilaterally.  ABDOMEN:  Soft, nontender, nondistended.  Normal bowel sounds.  EXTREMITIES:  No lower extremity edema.  SKIN:  No rash or lesion on the exposed extremities.   ASSESSMENT/PLAN:  A 60 year old man with end-stage renal disease,  routine rest for colorectal cancer.   Derrick Benton seems slightly confused about the details of his medical  history, not clear if he has had colonoscopy or sigmoidoscopy in the  past or where it was done.  I searched our [facility] records as best as  possible from our office and I found no record of any colonoscopy here.  He is in the final stages of preparing for a kidney transplant and we  will arrange for him to have a colonoscopy done at his soonest  convenience.  He had recent blood tests, two or three weeks ago, showing  a hemoglobin of 13.6, normal platelets, normal complete metabolic  profile except for an elevated creatinine of 14.  I see no reason for  any further blood tests or imaging studies prior to colonoscopy which we  will probably set up for next week.     Rachael Fee, MD  Electronically Signed    DPJ/MedQ  DD: 04/20/2006  DT: 04/20/2006  Job #: [id number]   cc:   Dyke Maes, M.D.

## 2011-01-15 LAB — DIFFERENTIAL
Basophils Absolute: 0
Basophils Relative: 0
Eosinophils Relative: 1
Monocytes Absolute: 0.4
Monocytes Relative: 5

## 2011-01-15 LAB — I-STAT 8, (EC8 V) (CONVERTED LAB)
Acid-Base Excess: 6 — ABNORMAL HIGH
Glucose, Bld: 95
Potassium: 4.5
TCO2: 36
pCO2, Ven: 61.4 — ABNORMAL HIGH
pH, Ven: 7.357 — ABNORMAL HIGH

## 2011-01-15 LAB — CBC
HCT: 44.7
Hemoglobin: 14.8
MCHC: 33
MCV: 100.4 — ABNORMAL HIGH
RBC: 4.45
RDW: 16.6 — ABNORMAL HIGH

## 2011-01-21 LAB — CBC
HCT: 45.5
HCT: 48.3
Hemoglobin: 13.3
Hemoglobin: 15.8
MCHC: 32.8
MCV: 99.3
MCV: 99.5
Platelets: 214
Platelets: 226
Platelets: 232
RBC: 4.85
RBC: 5.13
RDW: 15
RDW: 15.3
RDW: 15.6 — ABNORMAL HIGH
WBC: 9.9

## 2011-01-21 LAB — COMPREHENSIVE METABOLIC PANEL
ALT: 14
ALT: 14
AST: 20
Albumin: 4.1
Alkaline Phosphatase: 167 — ABNORMAL HIGH
BUN: 28 — ABNORMAL HIGH
CO2: 31
Chloride: 95 — ABNORMAL LOW
Creatinine, Ser: 11.03 — ABNORMAL HIGH
GFR calc Af Amer: 6 — ABNORMAL LOW
GFR calc non Af Amer: 4 — ABNORMAL LOW
Glucose, Bld: 128 — ABNORMAL HIGH
Potassium: 4.4
Potassium: 5
Sodium: 140
Sodium: 142
Total Bilirubin: 0.8

## 2011-01-21 LAB — DIFFERENTIAL
Basophils Absolute: 0
Basophils Relative: 0
Eosinophils Absolute: 0
Neutro Abs: 11.8 — ABNORMAL HIGH
Neutrophils Relative %: 86 — ABNORMAL HIGH

## 2011-01-21 LAB — BASIC METABOLIC PANEL
BUN: 42 — ABNORMAL HIGH
Creatinine, Ser: 14.17 — ABNORMAL HIGH
GFR calc non Af Amer: 4 — ABNORMAL LOW
Glucose, Bld: 87
Potassium: 4.9

## 2011-01-21 LAB — LIPASE, BLOOD: Lipase: 34

## 2011-01-21 LAB — RENAL FUNCTION PANEL
Albumin: 2.8 — ABNORMAL LOW
Phosphorus: 3.6
Potassium: 4.4
Sodium: 142

## 2011-02-11 LAB — DIFFERENTIAL
Basophils Absolute: 0
Basophils Relative: 1
Eosinophils Absolute: 0
Eosinophils Relative: 0
Lymphocytes Relative: 12
Lymphs Abs: 1.1
Monocytes Absolute: 0.9 — ABNORMAL HIGH
Monocytes Relative: 10
Neutro Abs: 7.2
Neutrophils Relative %: 78 — ABNORMAL HIGH

## 2011-02-11 LAB — CULTURE, BLOOD (ROUTINE X 2)
Culture: NO GROWTH
Culture: NO GROWTH

## 2011-02-11 LAB — COMPREHENSIVE METABOLIC PANEL
ALT: 16
AST: 22
CO2: 23
Calcium: 9.1
Chloride: 100
GFR calc Af Amer: 3 — ABNORMAL LOW
GFR calc non Af Amer: 3 — ABNORMAL LOW
Sodium: 138
Total Bilirubin: 0.6

## 2011-02-11 LAB — URINALYSIS, ROUTINE W REFLEX MICROSCOPIC
Bilirubin Urine: NEGATIVE
Glucose, UA: 100 — AB
Ketones, ur: NEGATIVE
Nitrite: NEGATIVE
Protein, ur: 100 — AB
Specific Gravity, Urine: 1.012
Urobilinogen, UA: 0.2
pH: 8

## 2011-02-11 LAB — URINE CULTURE
Colony Count: NO GROWTH
Culture: NO GROWTH

## 2011-02-11 LAB — COMPREHENSIVE METABOLIC PANEL WITH GFR
Albumin: 3.8
Alkaline Phosphatase: 97
BUN: 56 — ABNORMAL HIGH
Creatinine, Ser: 18.24 — ABNORMAL HIGH
Glucose, Bld: 91
Potassium: 5.8 — ABNORMAL HIGH
Total Protein: 7.8

## 2011-02-11 LAB — LIPASE, BLOOD: Lipase: 33

## 2011-02-11 LAB — POCT CARDIAC MARKERS: Troponin i, poc: <0.05

## 2011-02-11 LAB — CBC
HCT: 46.4
Hemoglobin: 15.2
MCHC: 32.8
MCV: 100.9 — ABNORMAL HIGH
Platelets: 266
RBC: 4.6
RDW: 15.9 — ABNORMAL HIGH
WBC: 9.2

## 2011-02-11 LAB — URINE MICROSCOPIC-ADD ON

## 2019-06-25 ENCOUNTER — Ambulatory Visit: Payer: Self-pay | Attending: Internal Medicine

## 2019-06-25 DIAGNOSIS — Z23 Encounter for immunization: Secondary | ICD-10-CM | POA: Insufficient documentation

## 2019-06-25 NOTE — Progress Notes (Signed)
   Covid-19 Vaccination Clinic  Name:  Derrick Benton    MRN: [id number] DOB: 1950/09/25  06/25/2019  Derrick Benton was observed post Covid-19 immunization for 15 minutes without incidence. He was provided with Vaccine Information Sheet and instruction to access the V-Safe system.   Derrick Benton was instructed to call 911 with any severe reactions post vaccine: Marland Kitchen Difficulty breathing  . Swelling of your face and throat  . A fast heartbeat  . A bad rash all over your body  . Dizziness and weakness    Immunizations Administered    Name Date Dose VIS Date Route   Pfizer COVID-19 Vaccine 06/25/2019  1:41 PM 0.3 mL 04/06/2019 Intramuscular   Manufacturer: [name], [employer]   Lot: [id number]   NDC: [address]-9

## 2019-07-18 ENCOUNTER — Ambulatory Visit: Payer: Self-pay | Attending: Internal Medicine

## 2019-07-18 DIAGNOSIS — Z23 Encounter for immunization: Secondary | ICD-10-CM

## 2019-07-18 NOTE — Progress Notes (Signed)
   Covid-19 Vaccination Clinic  Name:  Derrick Benton    MRN: [id number] DOB: 04-19-1951  07/18/2019  [name] was observed post Covid-19 immunization for 15 minutes without incident. He was provided with Vaccine Information Sheet and instruction to access the V-Safe system.   [name] was instructed to call 911 with any severe reactions post vaccine: Marland Kitchen Difficulty breathing  . Swelling of face and throat  . A fast heartbeat  . A bad rash all over body  . Dizziness and weakness   Immunizations Administered    Name Date Dose VIS Date Route   Pfizer COVID-19 Vaccine 07/18/2019 12:24 PM 0.3 mL 04/06/2019 Intramuscular   Manufacturer: [name], [employer]   Lot: [id number]   NDC: [address]-6

## 2019-12-10 ENCOUNTER — Other Ambulatory Visit: Payer: Self-pay | Admitting: Physician Assistant

## 2019-12-10 ENCOUNTER — Encounter: Payer: Self-pay | Admitting: Physician Assistant

## 2019-12-10 DIAGNOSIS — I739 Peripheral vascular disease, unspecified: Secondary | ICD-10-CM

## 2019-12-10 DIAGNOSIS — U071 COVID-19: Secondary | ICD-10-CM

## 2019-12-10 DIAGNOSIS — Z940 Kidney transplant status: Secondary | ICD-10-CM

## 2019-12-10 NOTE — Progress Notes (Signed)
I connected by phone with Derrick Benton on 12/10/2019 at 1:23 PM to discuss the potential use of a new treatment for mild to moderate COVID-19 viral infection in non-hospitalized patients.  This patient is a 69 y.o. male that meets the FDA criteria for Emergency Use Authorization of COVID monoclonal antibody casirivimab/imdevimab.  Has a (+) direct SARS-CoV-2 viral test result  Has mild or moderate COVID-19   Is NOT hospitalized due to COVID-19  Is within 10 days of symptom onset  Has at least one of the high risk factor(s) for progression to severe COVID-19 and/or hospitalization as defined in EUA.  Specific high risk criteria : Older age (>/= 71 yo)   I have spoken and communicated the following to the patient or parent/caregiver regarding COVID monoclonal antibody treatment:  1. FDA has authorized the emergency use for the treatment of mild to moderate COVID-19 in adults and pediatric patients with positive results of direct SARS-CoV-2 viral testing who are 3 years of age and older weighing at least 40 kg, and who are at high risk for progressing to severe COVID-19 and/or hospitalization.  2. The significant known and potential risks and benefits of COVID monoclonal antibody, and the extent to which such potential risks and benefits are unknown.  3. Information on available alternative treatments and the risks and benefits of those alternatives, including clinical trials.  4. Patients treated with COVID monoclonal antibody should continue to self-isolate and use infection control measures (e.g., wear mask, isolate, social distance, avoid sharing personal items, clean and disinfect "high touch" surfaces, and frequent handwashing) according to CDC guidelines.   5. The patient or parent/caregiver has the option to accept or refuse COVID monoclonal antibody treatment.  After reviewing this information with the patient, The patient agreed to proceed with receiving casirivimab\imdevimab  infusion and will be provided a copy of the Fact sheet prior to receiving the infusion.  Sx onset 8/11. Set up for infusion on 8/17 @ 10:30am. Directions given to [facility]. Pt is aware that insurance will be charged an infusion fee. Of note, he is fully vaccinated with [name]. I have added him to our breakthrough list.   Cline Crock 12/10/2019 1:23 PM

## 2019-12-11 ENCOUNTER — Ambulatory Visit (HOSPITAL_COMMUNITY)
Admission: RE | Admit: 2019-12-11 | Discharge: 2019-12-11 | Disposition: A | Discharge status: Home / Self Care | Payer: HRSA Program | Source: Ambulatory Visit | Attending: Pulmonary Disease | Admitting: Pulmonary Disease

## 2019-12-11 DIAGNOSIS — I739 Peripheral vascular disease, unspecified: Secondary | ICD-10-CM | POA: Insufficient documentation

## 2019-12-11 DIAGNOSIS — Z940 Kidney transplant status: Secondary | ICD-10-CM | POA: Diagnosis not present

## 2019-12-11 DIAGNOSIS — U071 COVID-19: Secondary | ICD-10-CM | POA: Diagnosis not present

## 2019-12-11 MED ORDER — DIPHENHYDRAMINE HCL 50 MG/ML IJ SOLN: 50.0000 mg | Freq: Once | INTRAMUSCULAR | Status: DC | PRN

## 2019-12-11 MED ORDER — METHYLPREDNISOLONE SODIUM SUCC 125 MG IJ SOLR: 125.0000 mg | Freq: Once | INTRAMUSCULAR | Status: DC | PRN

## 2019-12-11 MED ORDER — SODIUM CHLORIDE 0.9 % IV SOLN
1200.0000 mg | Freq: Once | INTRAVENOUS | Status: AC
  Administered 2019-12-11: 1200 mg via INTRAVENOUS
  Filled 2019-12-11: qty 10

## 2019-12-11 MED ORDER — FAMOTIDINE IN NACL 20-0.9 MG/50ML-% IV SOLN: 20.0000 mg | Freq: Once | INTRAVENOUS | Status: DC | PRN

## 2019-12-11 MED ORDER — SODIUM CHLORIDE 0.9 % IV SOLN: INTRAVENOUS | Status: DC | PRN

## 2019-12-11 MED ORDER — ALBUTEROL SULFATE HFA 108 (90 BASE) MCG/ACT IN AERS: 2.0000 | INHALATION_SPRAY | Freq: Once | RESPIRATORY_TRACT | Status: DC | PRN

## 2019-12-11 MED ORDER — EPINEPHRINE 0.3 MG/0.3ML IJ SOAJ: 0.3000 mg | Freq: Once | INTRAMUSCULAR | Status: DC | PRN

## 2019-12-11 NOTE — Discharge Instructions (Signed)

## 2019-12-11 NOTE — Progress Notes (Signed)
Patient arrived to [facility]-infusion clinic for REGEN-COV. Infusion completed. VSS. Patient denied any distress. AVS reviewed. D/c'ed home.
